# Patient Record
Sex: Male | Born: 1995 | Race: Black or African American | Hispanic: No | Marital: Single | Smoking: Current every day smoker
Health system: Southern US, Community
[De-identification: ages and names within clinical notes are randomized; demographics above are authoritative.]

## PROBLEM LIST (undated history)

## (undated) DIAGNOSIS — G4733 Obstructive sleep apnea (adult) (pediatric): Secondary | ICD-10-CM

## (undated) DIAGNOSIS — E785 Hyperlipidemia, unspecified: Secondary | ICD-10-CM

## (undated) DIAGNOSIS — J45909 Unspecified asthma, uncomplicated: Secondary | ICD-10-CM

## (undated) DIAGNOSIS — N289 Disorder of kidney and ureter, unspecified: Secondary | ICD-10-CM

## (undated) DIAGNOSIS — K219 Gastro-esophageal reflux disease without esophagitis: Secondary | ICD-10-CM

## (undated) DIAGNOSIS — I219 Acute myocardial infarction, unspecified: Secondary | ICD-10-CM

## (undated) DIAGNOSIS — I10 Essential (primary) hypertension: Secondary | ICD-10-CM

---

## 2019-09-07 ENCOUNTER — Emergency Department (HOSPITAL_COMMUNITY)

## 2019-09-07 ENCOUNTER — Other Ambulatory Visit: Payer: Self-pay

## 2019-09-07 ENCOUNTER — Encounter (HOSPITAL_COMMUNITY): Payer: Self-pay

## 2019-09-07 ENCOUNTER — Observation Stay (HOSPITAL_COMMUNITY)
Admission: EM | Admit: 2019-09-07 | Discharge: 2019-09-08 | Disposition: A | Discharge status: Home / Self Care | Attending: Family Medicine | Admitting: Family Medicine

## 2019-09-07 DIAGNOSIS — R079 Chest pain, unspecified: Secondary | ICD-10-CM | POA: Diagnosis not present

## 2019-09-07 DIAGNOSIS — F17210 Nicotine dependence, cigarettes, uncomplicated: Secondary | ICD-10-CM | POA: Insufficient documentation

## 2019-09-07 DIAGNOSIS — Z20822 Contact with and (suspected) exposure to covid-19: Secondary | ICD-10-CM | POA: Insufficient documentation

## 2019-09-07 DIAGNOSIS — J209 Acute bronchitis, unspecified: Secondary | ICD-10-CM | POA: Diagnosis present

## 2019-09-07 DIAGNOSIS — F1590 Other stimulant use, unspecified, uncomplicated: Secondary | ICD-10-CM | POA: Diagnosis present

## 2019-09-07 DIAGNOSIS — F1410 Cocaine abuse, uncomplicated: Secondary | ICD-10-CM | POA: Insufficient documentation

## 2019-09-07 DIAGNOSIS — R0789 Other chest pain: Secondary | ICD-10-CM | POA: Diagnosis not present

## 2019-09-07 DIAGNOSIS — Z720 Tobacco use: Secondary | ICD-10-CM | POA: Diagnosis present

## 2019-09-07 DIAGNOSIS — R0602 Shortness of breath: Secondary | ICD-10-CM | POA: Diagnosis present

## 2019-09-07 DIAGNOSIS — F1491 Cocaine use, unspecified, in remission: Secondary | ICD-10-CM

## 2019-09-07 LAB — CBC
HCT: 44.6 % (ref 39.0–52.0)
Hemoglobin: 15.2 g/dL (ref 13.0–17.0)
MCH: 30.8 pg (ref 26.0–34.0)
MCHC: 34.1 g/dL (ref 30.0–36.0)
MCV: 90.5 fL (ref 80.0–100.0)
Platelets: 293 10*3/uL (ref 150–400)
RBC: 4.93 MIL/uL (ref 4.22–5.81)
RDW: 12.6 % (ref 11.5–15.5)
WBC: 9.4 10*3/uL (ref 4.0–10.5)
nRBC: 0 % (ref 0.0–0.2)

## 2019-09-07 LAB — TROPONIN I (HIGH SENSITIVITY)
Troponin I (High Sensitivity): 4 ng/L (ref <18)
Troponin I (High Sensitivity): 5 ng/L (ref <18)

## 2019-09-07 LAB — D-DIMER, QUANTITATIVE: D-Dimer, Quant: 0.3 ug/mL-FEU (ref 0.00–0.50)

## 2019-09-07 LAB — BASIC METABOLIC PANEL
Anion gap: 14 (ref 5–15)
BUN: 18 mg/dL (ref 6–20)
CO2: 23 mmol/L (ref 22–32)
Calcium: 9.9 mg/dL (ref 8.9–10.3)
Chloride: 100 mmol/L (ref 98–111)
Creatinine, Ser: 1.24 mg/dL (ref 0.61–1.24)
GFR calc Af Amer: >60 mL/min (ref >60)
GFR calc non Af Amer: >60 mL/min (ref >60)
Glucose, Bld: 99 mg/dL (ref 70–99)
Potassium: 3.5 mmol/L (ref 3.5–5.1)
Sodium: 137 mmol/L (ref 135–145)

## 2019-09-07 LAB — RAPID URINE DRUG SCREEN, HOSP PERFORMED
Amphetamines: POSITIVE — AB
Barbiturates: NOT DETECTED
Benzodiazepines: NOT DETECTED
Cocaine: NOT DETECTED
Opiates: NOT DETECTED
Tetrahydrocannabinol: NOT DETECTED

## 2019-09-07 MED ORDER — ONDANSETRON HCL 4 MG/2ML IJ SOLN: 4.0000 mg | Freq: Four times a day (QID) | INTRAMUSCULAR | Status: DC | PRN

## 2019-09-07 MED ORDER — ACETAMINOPHEN 325 MG PO TABS: 650.0000 mg | ORAL_TABLET | Freq: Four times a day (QID) | ORAL | Status: DC | PRN

## 2019-09-07 MED ORDER — NITROGLYCERIN 2 % TD OINT
1.0000 [in_us] | TOPICAL_OINTMENT | Freq: Once | TRANSDERMAL | Status: AC
  Administered 2019-09-07: 1 [in_us] via TOPICAL
  Filled 2019-09-07: qty 1

## 2019-09-07 MED ORDER — LORAZEPAM 2 MG/ML IJ SOLN
1.0000 mg | Freq: Once | INTRAMUSCULAR | Status: AC
  Administered 2019-09-07: 1 mg via INTRAVENOUS
  Filled 2019-09-07: qty 1

## 2019-09-07 MED ORDER — ONDANSETRON HCL 4 MG/2ML IJ SOLN
4.0000 mg | Freq: Once | INTRAMUSCULAR | Status: AC
  Administered 2019-09-07: 4 mg via INTRAVENOUS
  Filled 2019-09-07: qty 2

## 2019-09-07 MED ORDER — ENOXAPARIN SODIUM 40 MG/0.4ML ~~LOC~~ SOLN
40.0000 mg | SUBCUTANEOUS | Status: DC
  Administered 2019-09-08: 40 mg via SUBCUTANEOUS
  Filled 2019-09-07: qty 0.4

## 2019-09-07 MED ORDER — ASPIRIN 81 MG PO CHEW
324.0000 mg | CHEWABLE_TABLET | Freq: Once | ORAL | Status: AC
  Administered 2019-09-07: 324 mg via ORAL
  Filled 2019-09-07: qty 4

## 2019-09-07 MED ORDER — NITROGLYCERIN 0.4 MG SL SUBL
0.4000 mg | SUBLINGUAL_TABLET | Freq: Once | SUBLINGUAL | Status: AC
  Administered 2019-09-07: 0.4 mg via SUBLINGUAL
  Filled 2019-09-07: qty 1

## 2019-09-07 MED ORDER — NITROGLYCERIN 0.4 MG SL SUBL
SUBLINGUAL_TABLET | SUBLINGUAL | Status: AC
  Filled 2019-09-07: qty 2

## 2019-09-07 MED ORDER — ACETAMINOPHEN 650 MG RE SUPP: 650.0000 mg | Freq: Four times a day (QID) | RECTAL | Status: DC | PRN

## 2019-09-07 MED ORDER — MORPHINE SULFATE (PF) 4 MG/ML IV SOLN
4.0000 mg | Freq: Once | INTRAVENOUS | Status: AC
  Administered 2019-09-07: 4 mg via INTRAVENOUS
  Filled 2019-09-07: qty 1

## 2019-09-07 MED ORDER — POTASSIUM CHLORIDE IN NACL 20-0.9 MEQ/L-% IV SOLN: INTRAVENOUS | Status: AC

## 2019-09-07 MED ORDER — ONDANSETRON HCL 4 MG PO TABS: 4.0000 mg | ORAL_TABLET | Freq: Four times a day (QID) | ORAL | Status: DC | PRN

## 2019-09-07 NOTE — ED Triage Notes (Signed)
Pt coming from [place] due to SOB for 2 days and getting for last hour. Pt used MDI without relief. No cough . Reports pain left chest

## 2019-09-07 NOTE — ED Notes (Signed)
[place] at 2 LPM for SOB

## 2019-09-07 NOTE — ED Provider Notes (Signed)
[facility] EMERGENCY DEPARTMENT Provider Note   CSN: [id number] Arrival date & time: 09/07/19  1656     History Chief Complaint  Patient presents with  . Shortness of Breath    Johnny Hebert is a 24 y.o. male with no significant past medical history reports having some mild shortness of breath over the past several days.  Today while he was walking on a track he developed increased shortness of breath in addition to sharp left-sided chest pain which has been constant for the past hour.  He has had several doses of an albuterol MDI given by the medical staff at his present, his last dose of this was just prior to arrival.  He denies improvement with this medication.  He does have a history of asthma and does endorse some mild wheezing over the past several days.  He denies peripheral edema, pain or swelling of his extremities.  He does smoke cigarettes, he denies drug abuse.  No significant family history of cardiac disease and early age.  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  He was referred to a cardiologist which he has not been able to see, as he was incarcerated before his appointment.   The history is provided by the patient.       History reviewed. No pertinent past medical history.  There are no problems to display for this patient.   History reviewed. No pertinent surgical history.     No family history on file.  Social History   Tobacco Use  . Smoking status: Current Every Day Smoker    Packs/day: 0.50    Types: Cigarettes  Substance Use Topics  . Alcohol use: Not Currently  . Drug use: Not Currently    Home Medications Prior to Admission medications   Not on File    Allergies    Patient has no known allergies.  Review of Systems   Review of Systems  Constitutional: Negative for chills and fever.  HENT: Negative for congestion and sore throat.   Eyes: Negative.     Respiratory: Positive for chest tightness and shortness of breath.   Cardiovascular: Positive for chest pain. Negative for palpitations and leg swelling.  Gastrointestinal: Negative for abdominal pain and nausea.  Genitourinary: Negative.   Musculoskeletal: Negative for arthralgias, joint swelling and neck pain.  Skin: Negative.  Negative for rash and wound.  Neurological: Negative for dizziness, weakness, light-headedness, numbness and headaches.  Psychiatric/Behavioral: Negative.     Physical Exam Updated Vital Signs BP (!) 140/97 (BP Location: Left Arm)   Pulse 77   Temp 98.2 F (36.8 C) (Oral)   Resp (!) 30   Ht 5\' 5"  (1.651 m)   Wt 119.7 kg   SpO2 99%   BMI 43.93 kg/m   Physical Exam Vitals and nursing note reviewed.  Constitutional:      Appearance: He is well-developed.  HENT:     Head: Normocephalic and atraumatic.  Eyes:     Conjunctiva/sclera: Conjunctivae normal.  Cardiovascular:     Rate and Rhythm: Normal rate and regular rhythm.     Heart sounds: Normal heart sounds.  Pulmonary:     Effort: Pulmonary effort is normal.     Breath sounds: Normal breath sounds. No decreased breath sounds, wheezing or rhonchi.  Abdominal:     General: Bowel sounds are normal.     Palpations: Abdomen is soft.  Tenderness: There is no abdominal tenderness.  Musculoskeletal:        General: Normal range of motion.     Cervical back: Normal range of motion.     Right lower leg: No tenderness. No edema.     Left lower leg: No tenderness. No edema.  Skin:    General: Skin is warm and dry.  Neurological:     General: No focal deficit present.     Mental Status: He is alert.     ED Results / Procedures / Treatments   Labs (all labs ordered are listed, but only abnormal results are displayed) Labs Reviewed  BASIC METABOLIC PANEL  CBC  D-DIMER, QUANTITATIVE (NOT AT [facility])  RAPID URINE DRUG SCREEN, HOSP PERFORMED  TROPONIN I (HIGH SENSITIVITY)    EKG EKG  Interpretation  Date/Time:  Sunday September 07 2019 17:43:49 EDT Ventricular Rate:  80 PR Interval:    QRS Duration: 88 QT Interval:  345 QTC Calculation: 398 R Axis:   81 Text Interpretation: Sinus rhythm RSR' in V1 or V2, right VCD or RVH No previous ECGs available Confirmed by Fredia Sorrow [phone]) on 09/07/2019 7:17:43 PM   Radiology DG Chest 2 View  Result Date: 09/07/2019 CLINICAL DATA:  Dyspnea for 2 days, smoker EXAM: CHEST - 2 VIEW COMPARISON:  None. FINDINGS: Normal heart size. Normal mediastinal contour. No pneumothorax. No pleural effusion. No acute consolidative airspace disease. No overt pulmonary edema. Mild peribronchial cuffing. No significant lung hyperinflation. IMPRESSION: Mild peribronchial cuffing, suggesting bronchitis.  No pneumonia. Electronically Signed   By: Ilona Sorrel M.D.   On: 09/07/2019 18:14    Procedures Procedures (including critical care time)  Medications Ordered in ED Medications  nitroGLYCERIN (NITROSTAT) 0.4 MG SL tablet (has no administration in time range)  aspirin chewable tablet 324 mg (324 mg Oral Given 09/07/19 1847)  nitroGLYCERIN (NITROSTAT) SL tablet 0.4 mg (0.4 mg Sublingual Given 09/07/19 1836)  morphine 4 MG/ML injection 4 mg (4 mg Intravenous Given 09/07/19 1905)  ondansetron (ZOFRAN) injection 4 mg (4 mg Intravenous Given 09/07/19 1904)  nitroGLYCERIN (NITROGLYN) 2 % ointment 1 inch (1 inch Topical Given 09/07/19 1905)    ED Course  I have reviewed the triage vital signs and the nursing notes.  Pertinent labs & imaging results that were available during my care of the patient were reviewed by me and considered in my medical decision making (see chart for details).    MDM Rules/Calculators/A&P                          Pt was given aspirin 325 mg,  ntg x 1 after which he developed sinus tachycardic to 140-150 range.  No improvement with valsalva or carotid massage.  Ekg  Sinus tachy, initial troponin normal range.    7:14 PM Pt  admitted to RN at this time that he did a "couple lines of cocaine" today.    Pending additional labs, repeat ekg still sinus tachy. Morphine added, ntg paste in place of SL ntg.   Pt signed out to Alyse Low PA at shift change pending remaining labs including delta trop, uds.   Final Clinical Impression(s) / ED Diagnoses Final diagnoses:  Chest pain, unspecified type  Cocaine abuse [facility])    Rx / DC Orders ED Discharge Orders    None       Landis Martins 09/07/19 1918    Fredia Sorrow, MD 09/15/19 [id number]

## 2019-09-07 NOTE — H&P (Signed)
History and Physical    Johnny Hebert QPY:[id number] DOB: 1996/03/08 DOA: 09/07/2019  PCP: Patient, No Pcp Per  Patient coming from: Home.  I have personally briefly reviewed patient's old medical records in [place] [facility]  Chief Complaint: Chest pain.  HPI: Johnny Hebert is a 24 y.o. 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  He had several doses of albuterol MDI prior to arrival.  He mentions becoming very anxious because he was thinking he was having "heart attack".  He denies fever, rhinorrhea, chills, hemoptysis, dizziness, PND, orthopnea or pitting edema of the lower extremities.  No abdominal pain, nausea, vomiting, diarrhea, constipation, melena or hematochezia.  No dysuria, frequency or hematuria.  ED Course: Initial vital signs were temperature 98.2 F, pulse 99, respirations 30, BP 140/97 and O2 sat 96% on room air.  The patient was given supplemental oxygen, chewable aspirin 324 mg, sublingual nitroglycerin,, lorazepam 1 mg IVP, morphine 4 mg IVP x1 and ondansetron 4 mg IVP.  UDS was positive for amphetamines.  CBC, D-dimer BMP and troponin x2.  See multiple EKG results below.  His chest radiograph shows peribronchial cuffing.  Review of Systems: As per HPI otherwise all other systems reviewed and are negative.  History reviewed. No pertinent past medical history.  History reviewed. No pertinent surgical history.  Social History  reports that he has been smoking cigarettes. He has been smoking about 0.50 packs per day. He does not have any smokeless tobacco history on file. He reports previous alcohol use. He  reports previous drug use.  No Known Allergies  Family medical history. Both parents with history of hypertension.  Prior to Admission medications   Not on File    Physical Exam: Vitals:   09/07/19 2158 09/07/19 2246 09/07/19 2301 09/07/19 2330  BP: (!) 104/45  124/72 129/77  Pulse: (!) 113     Resp: (!) 28  (!) 26 (!) 24  Temp:      TempSrc:      SpO2: 100% 100%    Weight:      Height:       Constitutional: NAD, calm, comfortable Eyes: PERRL, lids and conjunctivae normal ENMT: Mucous membranes are moist. Posterior pharynx clear of any exudate or lesions. Neck: normal, supple, no masses, no thyromegaly Respiratory: clear to auscultation bilaterally, no wheezing, no crackles. Normal respiratory effort. No accessory muscle use.  Cardiovascular: Regular rate and rhythm, no murmurs / rubs / gallops. No extremity edema. 2+ pedal pulses. No carotid bruits.  Abdomen: Obese, nondistended.  Soft, no tenderness, no masses palpated. No hepatosplenomegaly. Bowel sounds positive.  Musculoskeletal: no clubbing / cyanosis. Good ROM, no contractures. Normal muscle tone.  Skin: no acute or clinically significant rashes, lesions, ulcers on very limited dermatological examination. Neurologic: CN 2-12 grossly intact. Sensation intact, DTR normal. Strength 5/5 in all 4.  Psychiatric: Normal judgment and insight. Alert and oriented x 3. Normal mood.   Labs on Admission: I have personally reviewed following labs and imaging studies  CBC: Recent Labs  Lab 09/07/19 1820  WBC 9.4  HGB 15.2  HCT 44.6  MCV 90.5  PLT 124   Basic Metabolic Panel: Recent Labs  Lab  09/07/19 1820  NA 137  K 3.5  CL 100  CO2 23  GLUCOSE 99  BUN 18  CREATININE 1.24  CALCIUM 9.9   GFR: Estimated Creatinine Clearance: 109.2 mL/min (by C-G formula based on SCr of 1.24 mg/dL).  Liver Function Tests: No results for input(s): AST, ALT, ALKPHOS, BILITOT, PROT, ALBUMIN in the last 168 hours.  Urine  analysis: No results found for: COLORURINE, APPEARANCEUR, LABSPEC, PHURINE, GLUCOSEU, HGBUR, BILIRUBINUR, KETONESUR, PROTEINUR, UROBILINOGEN, NITRITE, LEUKOCYTESUR  Radiological Exams on Admission: DG Chest 2 View  Result Date: 09/07/2019 CLINICAL DATA:  Dyspnea for 2 days, smoker EXAM: CHEST - 2 VIEW COMPARISON:  None. FINDINGS: Normal heart size. Normal mediastinal contour. No pneumothorax. No pleural effusion. No acute consolidative airspace disease. No overt pulmonary edema. Mild peribronchial cuffing. No significant lung hyperinflation. IMPRESSION: Mild peribronchial cuffing, suggesting bronchitis.  No pneumonia. Electronically Signed   By: Delbert Phenix M.D.   On: 09/07/2019 18:14   EKG: Independently reviewed.  EKG #1 Vent. rate 80 BPM PR interval * ms QRS duration 88 ms QT/QTc 345/398 ms P-R-T axes 47 81 49 Sinus rhythm RSR' in V1 or V2, right VCD or RVH  EKG #2 Vent. rate 128 BPM PR interval * ms QRS duration 90 ms QT/QTc 306/447 ms P-R-T axes 36 89 37 Sinus tachycardia Probable left atrial enlargement Anteroseptal infarct, age indeterminate Lateral leads are also involved Baseline wander in lead(s) V6  EKG #3  Vent. rate 147 BPM PR interval * ms QRS duration 85 ms QT/QTc 281/440 ms P-R-T axes 56 125 42 Sinus tachycardia Anteroseptal infarct, age indeterminate  EKG #4 Vent. rate 128 BPM PR interval * ms QRS duration 89 ms QT/QTc 308/450 ms P-R-T axes 59 94 45 Sinus tachycardia LAE, consider biatrial enlargement Anteroseptal infarct, age indeterminate  Assessment/Plan Principal Problem:   Atypical chest pain (History of enlarged heart as a child) Observation/telemetry. Continue supplemental oxygen. Toradol 30 mg IVP x1 dose. Solu-Medrol 125 mg IVP x1 dose. Oxycodone 5 mg every 4 hours as needed. Repeat EKG in a.m. Check echocardiogram given electrocardiographic abnormalities  Active Problems:   Asthma Supplemental oxygen. Bronchodilators as  needed. Single dose Solu-Medrol. Smoking cessation.    Tobacco use Nicotine replacement therapy as needed. Staff to provide tobacco cessation information.    Amphetamine use (HCC) Advised against the use.    DVT prophylaxis: Lovenox SQ. Code Status:   Full code. Family Communication:   Disposition Plan:   Patient is from:  [facility].  Anticipated DC to:  [facility]  Anticipated DC date:  09/08/2019.  Anticipated DC barriers: Clinical improvement in echocardiogram.  Consults called: Admission status:  Observation/telemetry.  Severity of Illness:  High.  Bobette Mo MD [facility]  How to contact the [facility] Attending or Consulting provider 7A - 7P or covering provider during after hours 7P -7A, for this patient?   1. Check the care team in [facility] and look for a) attending/consulting [facility] provider listed and b) the [facility] team listed 2. Log into [email or web address] and use [place]'s universal password to access. If you do not have the password, please contact the hospital operator. 3. Locate the [facility] provider you are looking for under [facility] and page to a number that you can be directly reached. 4. If you still have difficulty reaching the provider, please page the [facility] (Director on Call) for the Hospitalists listed on amion for assistance.  09/07/2019, 11:50 PM   This document was prepared using Conservation officer, historic buildings  and may contain some unintended transcription errors.

## 2019-09-07 NOTE — ED Provider Notes (Signed)
Pt's care assumed from [facility].  Pt having severe chest pain. Pt reports he had an enlarged heart as a child.   Heart rate 130.  Pt sweating.  Pt admits to using 2 lines of cocaine tonight.   Nitro paste, morphine IV.  Pt remains anxious and continues to have pain.  Troponin is negative x 2.  Ddimer is negative,   Drug screen is positive for amphetamines.  Pt given ativan 1mg  IV.   I will consult hospitalist for admission    09/07/19 2324    09/09/19, MD 09/15/19 [phone]

## 2019-09-08 ENCOUNTER — Encounter (HOSPITAL_COMMUNITY): Payer: Self-pay | Admitting: Internal Medicine

## 2019-09-08 ENCOUNTER — Observation Stay (HOSPITAL_BASED_OUTPATIENT_CLINIC_OR_DEPARTMENT_OTHER)

## 2019-09-08 ENCOUNTER — Observation Stay (HOSPITAL_COMMUNITY)

## 2019-09-08 DIAGNOSIS — Z720 Tobacco use: Secondary | ICD-10-CM

## 2019-09-08 DIAGNOSIS — R079 Chest pain, unspecified: Secondary | ICD-10-CM

## 2019-09-08 DIAGNOSIS — F1491 Cocaine use, unspecified, in remission: Secondary | ICD-10-CM

## 2019-09-08 DIAGNOSIS — J209 Acute bronchitis, unspecified: Secondary | ICD-10-CM

## 2019-09-08 DIAGNOSIS — F1510 Other stimulant abuse, uncomplicated: Secondary | ICD-10-CM

## 2019-09-08 DIAGNOSIS — Z87898 Personal history of other specified conditions: Secondary | ICD-10-CM | POA: Diagnosis not present

## 2019-09-08 DIAGNOSIS — R0789 Other chest pain: Secondary | ICD-10-CM | POA: Diagnosis not present

## 2019-09-08 HISTORY — DX: Morbid (severe) obesity due to excess calories: E66.01

## 2019-09-08 LAB — MRSA PCR SCREENING: MRSA by PCR: NEGATIVE

## 2019-09-08 LAB — ECHOCARDIOGRAM COMPLETE
Height: 65 in
Weight: 4560.88 oz

## 2019-09-08 LAB — MAGNESIUM: Magnesium: 1.8 mg/dL (ref 1.7–2.4)

## 2019-09-08 LAB — HIV ANTIBODY (ROUTINE TESTING W REFLEX): HIV Screen 4th Generation wRfx: NONREACTIVE

## 2019-09-08 LAB — PHOSPHORUS: Phosphorus: 2.8 mg/dL (ref 2.5–4.6)

## 2019-09-08 LAB — SARS CORONAVIRUS 2 BY RT PCR (HOSPITAL ORDER, PERFORMED IN ~~LOC~~ HOSPITAL LAB): SARS Coronavirus 2: NEGATIVE

## 2019-09-08 MED ORDER — MUCINEX 600 MG PO TB12
600.0000 mg | ORAL_TABLET | Freq: Two times a day (BID) | ORAL | 0 refills | Status: AC
Start: 2019-09-08 — End: 2019-09-15

## 2019-09-08 MED ORDER — METHYLPREDNISOLONE SODIUM SUCC 125 MG IJ SOLR
125.0000 mg | Freq: Once | INTRAMUSCULAR | Status: AC
  Administered 2019-09-08: 125 mg via INTRAVENOUS
  Filled 2019-09-08: qty 2

## 2019-09-08 MED ORDER — FAMOTIDINE 20 MG PO TABS
20.0000 mg | ORAL_TABLET | Freq: Two times a day (BID) | ORAL | Status: DC
  Administered 2019-09-08: 20 mg via ORAL
  Filled 2019-09-08: qty 1

## 2019-09-08 MED ORDER — NITROGLYCERIN 0.4 MG SL SUBL
SUBLINGUAL_TABLET | SUBLINGUAL | Status: AC
  Filled 2019-09-08: qty 1

## 2019-09-08 MED ORDER — ALPRAZOLAM 0.5 MG PO TABS
0.5000 mg | ORAL_TABLET | Freq: Once | ORAL | Status: AC
  Administered 2019-09-08: 0.5 mg via ORAL
  Filled 2019-09-08: qty 1

## 2019-09-08 MED ORDER — ACETAMINOPHEN 325 MG PO TABS: 650.0000 mg | ORAL_TABLET | Freq: Four times a day (QID) | ORAL | Status: DC | PRN

## 2019-09-08 MED ORDER — ALBUTEROL SULFATE HFA 108 (90 BASE) MCG/ACT IN AERS
2.0000 | INHALATION_SPRAY | RESPIRATORY_TRACT | 0 refills | Status: AC | PRN
Start: 2019-09-08 — End: ?

## 2019-09-08 MED ORDER — ACETAMINOPHEN 325 MG PO TABS: 650.0000 mg | ORAL_TABLET | Freq: Four times a day (QID) | ORAL | Status: AC | PRN

## 2019-09-08 MED ORDER — OXYCODONE HCL 5 MG PO TABS
5.0000 mg | ORAL_TABLET | ORAL | Status: DC | PRN
  Administered 2019-09-08: 5 mg via ORAL
  Filled 2019-09-08: qty 1

## 2019-09-08 MED ORDER — KETOROLAC TROMETHAMINE 30 MG/ML IJ SOLN
30.0000 mg | Freq: Once | INTRAMUSCULAR | Status: AC
  Administered 2019-09-08: 30 mg via INTRAVENOUS
  Filled 2019-09-08: qty 1

## 2019-09-08 MED ORDER — ALPRAZOLAM 0.5 MG PO TABS: 0.5000 mg | ORAL_TABLET | Freq: Three times a day (TID) | ORAL | Status: DC | PRN

## 2019-09-08 MED ORDER — ENOXAPARIN SODIUM 40 MG/0.4ML ~~LOC~~ SOLN: 40.0000 mg | SUBCUTANEOUS | Status: DC

## 2019-09-08 MED ORDER — OMEPRAZOLE 20 MG PO CPDR: 20.0000 mg | DELAYED_RELEASE_CAPSULE | Freq: Every day | ORAL | 0 refills | Status: AC

## 2019-09-08 MED ORDER — IPRATROPIUM-ALBUTEROL 0.5-2.5 (3) MG/3ML IN SOLN: 3.0000 mL | RESPIRATORY_TRACT | Status: DC | PRN

## 2019-09-08 NOTE — Discharge Summary (Signed)
Physician Discharge Summary  Johnny Hebert ZOX:[id number] DOB: 1995/06/19 DOA: 09/07/2019  PCP: Patient, No Pcp Per  Admit date: 09/07/2019 Discharge date: 09/08/2019  Admitted From:  In custody (Incarcerated)  Disposition:  Return to custody (incarcerated)  Recommendations for Outpatient Follow-up:  1. Follow up with PCP when able  2. Avoid all recreational drugs and alcohol 3. Stop smoking and using tobacco products   Discharge Condition: Medically Stable   CODE STATUS: FULL    Brief Hospitalization Summary: Please see all hospital notes, images, labs for full details of the hospitalization.  ADMISSION HPI: Johnny Hebert is a 24 y.o. 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  He had several doses of albuterol MDI prior to arrival.  He mentions becoming very anxious because he was thinking he was having "heart attack".  He denies fever, rhinorrhea, chills, hemoptysis, dizziness, PND, orthopnea or pitting edema of the lower extremities.  No abdominal pain, nausea, vomiting, diarrhea, constipation, melena or hematochezia.  No dysuria, frequency or hematuria.  ED Course: Initial vital signs were temperature 98.2 F, pulse 99, respirations 30, BP 140/97 and O2 sat 96% on room air.  The patient was given supplemental oxygen, chewable aspirin 324 mg, sublingual nitroglycerin,, lorazepam 1 mg IVP, morphine 4 mg IVP x1 and ondansetron 4 mg IVP.  UDS was positive for amphetamines.  CBC, D-dimer BMP and troponin x2.  See multiple EKG results below.  His chest radiograph shows peribronchial cuffing.  The patient was admitted for  observation for atypical chest pain symptoms.  He had been using recreational drugs reportedly cocaine and amphetamines just prior to being incarcerated.  He complained of severe chest pain and some wheezing.  His chest x-ray showed findings of peribronchial cuffing suggesting bronchitis.  No other abnormalities.  His labs were totally normal.  His EKG was normal sinus rhythm with no ST-T wave abnormalities.  His high-sensitivity troponin was 4 and 5.  His urine drug screen was positive for amphetamines.  His SARS 2 coronavirus test was negative.  His MRSA PCR test was negative.  During part of the day he complained of an acute episode of chest pain he had a repeat EKG done and chest x-ray with normal findings.  His symptoms were thought to be related to a panic attack.  He was given a dose of alprazolam 0.5 mg x 1 and his symptoms resolved.  I suspect most of his symptoms are related to anxiety disorder and panic attacks.  But there was no findings of cardiac ischemia.  He had a 2D echocardiogram completed prior to discharge with normal findings noted below.  The patient is stable to discharge.  Will be discharged with a prescription for omeprazole, albuterol HFA and Mucinex and Tylenol as needed for pain.  He was strongly advised to stop using recreational drugs and smoking cigarettes alcohol.  He was advised to avoid all tobacco products.  He verbalized understanding.  Return as needed.  2D echocardiogram 09/08/2019 IMPRESSIONS  1. Left ventricular ejection fraction, by estimation, is 60 to 65%. The left ventricle has normal function. The left ventricle has no regional  wall motion abnormalities. There is mild left ventricular hypertrophy. Left ventricular  diastolic parameters were normal.  2. Right ventricular systolic function is normal. The right ventricular size is normal.  3. The mitral valve is normal in structure. No evidence of mitral valve regurgitation.  4. The aortic valve is normal in  structure. Aortic valve regurgitation is not visualized.  5. The inferior vena cava is normal in size with greater than 50% respiratory variability, suggesting right atrial pressure of 3 mmHg.   Discharge Diagnoses:  Principal Problem:   Atypical chest pain Active Problems:   Tobacco use   Amphetamine use (HCC)   History of cocaine use   Acute bronchitis   Discharge Instructions:  Allergies as of 09/08/2019   No Known Allergies     Medication List    TAKE these medications   acetaminophen 325 MG tablet Commonly known as: TYLENOL Take 2 tablets (650 mg total) by mouth every 6 (six) hours as needed for mild pain, moderate pain or headache (or Fever >/= 101).   albuterol 108 (90 Base) MCG/ACT inhaler Commonly known as: VENTOLIN HFA Inhale 2 puffs into the lungs every 4 (four) hours as needed for wheezing or shortness of breath (cough, shortness of breath or wheezing.).   Mucinex 600 MG 12 hr tablet Generic drug: guaiFENesin Take 1 tablet (600 mg total) by mouth 2 (two) times daily for 7 days.   omeprazole 20 MG capsule Commonly known as: PriLOSEC Take 1 capsule (20 mg total) by mouth daily.       No Known Allergies Allergies as of 09/08/2019   No Known Allergies     Medication List    TAKE these medications   acetaminophen 325 MG tablet Commonly known as: TYLENOL Take 2 tablets (650 mg total) by mouth every 6 (six) hours as needed for mild pain, moderate pain or headache (or Fever >/= 101).   albuterol 108 (90 Base) MCG/ACT inhaler Commonly known as: VENTOLIN HFA Inhale 2 puffs into the lungs every 4 (four) hours as needed for wheezing or shortness of breath (cough, shortness of breath or wheezing.).   Mucinex 600 MG 12 hr tablet Generic drug: guaiFENesin Take 1 tablet (600 mg total) by mouth 2 (two) times daily for 7 days.   omeprazole 20 MG capsule Commonly known as: PriLOSEC Take 1 capsule (20 mg total) by mouth daily.       Procedures/Studies: DG  Chest 2 View  Result Date: 09/07/2019 CLINICAL DATA:  Dyspnea for 2 days, smoker EXAM: CHEST - 2 VIEW COMPARISON:  None. FINDINGS: Normal heart size. Normal mediastinal contour. No pneumothorax. No pleural effusion. No acute consolidative airspace disease. No overt pulmonary edema. Mild peribronchial cuffing. No significant lung hyperinflation. IMPRESSION: Mild peribronchial cuffing, suggesting bronchitis.  No pneumonia. Electronically Signed   By: Ilona Sorrel M.D.   On: 09/07/2019 18:14   DG CHEST PORT 1 VIEW  Result Date: 09/08/2019 CLINICAL DATA:  Shortness of breath EXAM: PORTABLE CHEST 1 VIEW COMPARISON:  09/07/2019 FINDINGS: The heart size and mediastinal contours are within normal limits. Mildly prominent peribronchial markings bilaterally, similar to prior. No new focal airspace consolidation, pleural effusion, or pneumothorax. The visualized skeletal structures are unremarkable. IMPRESSION: Mildly prominent peribronchial markings bilaterally, similar to prior study, may reflect bronchitis. No new focal airspace consolidation. Electronically Signed   By: Davina Poke D.O.   On: 09/08/2019 13:07   ECHOCARDIOGRAM COMPLETE  Result Date: 09/08/2019    ECHOCARDIOGRAM REPORT   Patient Name:   Johnny Hebert Date of Exam: 09/08/2019 Medical Rec #:  [id number]  Height:       65.0 in Accession #:    [id number]         Weight:       285.1 lb Date of Birth:  09-May-1995           BSA:          2.300 m Patient Age:    23 years           BP:           129/80 mmHg Patient Gender: M                  HR:           69 bpm. [facility] Procedure: 2D Echo Indications:    Abnormal ECG 794.31 / R94.31                 Chest Pain 786.50 / R07.9  History:        Patient has no prior history of Echocardiogram examinations.                 Signs/Symptoms:Chest Pain; Risk Factors:Current Smoker.                 Amphetamine Use, inhaled some powder, which he thought was                 cocaine.   Sonographer:    Jeryl Columbia RDCS (AE) Referring Phys: [id number] DAVID MANUEL ORTIZ IMPRESSIONS  1. Left ventricular ejection fraction, by estimation, is 60 to 65%. The left ventricle has normal function. The left ventricle has no regional wall motion abnormalities. There is mild left ventricular hypertrophy. Left ventricular diastolic parameters were normal.  2. Right ventricular systolic function is normal. The right ventricular size is normal.  3. The mitral valve is normal in structure. No evidence of mitral valve regurgitation.  4. The aortic valve is normal in structure. Aortic valve regurgitation is not visualized.  5. The inferior vena cava is normal in size with greater than 50% respiratory variability, suggesting right atrial pressure of 3 mmHg. FINDINGS  Left Ventricle: Left ventricular ejection fraction, by estimation, is 60 to 65%. The left ventricle has normal function. The left ventricle has no regional wall motion abnormalities. The left ventricular internal cavity size was normal in size. There is  mild left ventricular hypertrophy. Left ventricular diastolic parameters were normal. Right Ventricle: The right ventricular size is normal. No increase in right ventricular wall thickness. Right ventricular systolic function is normal. Left Atrium: Left atrial size was normal in size. Right Atrium: Right atrial size was normal in size. Pericardium: There is no evidence of pericardial effusion. Mitral Valve: The mitral valve is normal in structure. No evidence of mitral valve regurgitation. Tricuspid Valve: The tricuspid valve is normal in structure. Tricuspid valve regurgitation is trivial. Aortic Valve: The aortic valve is normal in structure. Aortic valve regurgitation is not visualized. Pulmonic Valve: The pulmonic valve was not well visualized. Pulmonic valve regurgitation is not visualized. Aorta: The aortic root is normal in size and structure. Venous: The inferior vena cava is normal in size with  greater than 50% respiratory variability, suggesting right atrial pressure of 3 mmHg. IAS/Shunts: No atrial level shunt detected by color flow Doppler.  LEFT VENTRICLE PLAX 2D LVIDd:         4.19 cm  Diastology LVIDs:         2.37 cm  LV e' lateral:   9.79  cm/s LV PW:         1.30 cm  LV E/e' lateral: 6.3 LV IVS:        0.98 cm  LV e' medial:    7.94 cm/s LVOT diam:     2.20 cm  LV E/e' medial:  7.8 LVOT Area:     3.80 cm  RIGHT VENTRICLE RV S prime:     16.20 cm/s TAPSE (M-mode): 2.8 cm LEFT ATRIUM           Index       RIGHT ATRIUM           Index LA diam:      3.10 cm 1.35 cm/m  RA Area:     10.20 cm LA Vol (A2C): 38.8 ml 16.87 ml/m RA Volume:   21.30 ml  9.26 ml/m LA Vol (A4C): 34.2 ml 14.87 ml/m   AORTA Ao Root diam: 3.30 cm MITRAL VALVE MV Area (PHT): 2.32 cm    SHUNTS MV Decel Time: 327 msec    Systemic Diam: 2.20 cm MV E velocity: 61.60 cm/s MV A velocity: 57.20 cm/s MV E/A ratio:  1.08 Dietrich Pates MD Electronically signed by Dietrich Pates MD Signature Date/Time: 09/08/2019/5:29:56 PM    Final       Subjective: Pt having a lot of anxiety likely secondary to the recreational drugs he was taking. He has some mild wheezing.   Discharge Exam: Vitals:   09/08/19 0534 09/08/19 1516  BP: 129/80 129/86  Pulse: 69 62  Resp: 16   Temp: 98 F (36.7 C) 97.9 F (36.6 C)  SpO2: 98% 99%   Vitals:   09/08/19 0101 09/08/19 0146 09/08/19 0534 09/08/19 1516  BP: 129/79 (!) 135/91 129/80 129/86  Pulse: 77 98 69 62  Resp: (!) 25 18 16    Temp:  98.1 F (36.7 C) 98 F (36.7 C) 97.9 F (36.6 C)  TempSrc:  Oral Oral Oral  SpO2: 90% 100% 98% 99%  Weight:  129.3 kg    Height:  5\' 5"  (1.651 m)     General: Pt is alert, awake, not in acute distress Cardiovascular: RRR, S1/S2 +, no rubs, no gallops Respiratory: good air movement bilaterally, rare exp wheezing at right base, no rhonchi Abdominal: Soft, NT, ND, bowel sounds + Extremities: no edema, no cyanosis   The results of significant diagnostics  from this hospitalization (including imaging, microbiology, ancillary and laboratory) are listed below for reference.     Microbiology: Recent Results (from the past 240 hour(s))  SARS Coronavirus 2 by RT PCR (hospital order, performed in [facility] hospital lab) Nasopharyngeal Nasopharyngeal Swab     Status: None   Collection Time: 09/07/19 11:43 PM   Specimen: Nasopharyngeal Swab  Result Value Ref Range Status   SARS Coronavirus 2 NEGATIVE NEGATIVE Final    Comment: (NOTE) SARS-CoV-2 target nucleic acids are NOT DETECTED.  The SARS-CoV-2 RNA is generally detectable in upper and lower respiratory specimens during the acute phase of infection. The lowest concentration of SARS-CoV-2 viral copies this assay can detect is 250 copies / mL. A negative result does not preclude SARS-CoV-2 infection and should not be used as the sole basis for treatment or other patient management decisions.  A negative result may occur with improper specimen collection / handling, submission of specimen other than nasopharyngeal swab, presence of viral mutation(s) within the areas targeted by this assay, and inadequate number of viral copies (<250 copies / mL). A negative result must be combined with  clinical observations, patient history, and epidemiological information.  Fact Sheet for Patients:   BoilerBrush.com.cyhttps://www.fda.gov/media/136312/download  Fact Sheet for Healthcare Providers: https://pope.com/https://www.fda.gov/media/136313/download  This test is not yet approved or  cleared by the [place] FDA and has been authorized for detection and/or diagnosis of SARS-CoV-2 by FDA under an Emergency Use Authorization (EUA).  This EUA will remain in effect (meaning this test can be used) for the duration of the COVID-19 declaration under Section 564(b)(1) of the Act, 21 U.S.C. section 360bbb-3(b)(1), unless the authorization is terminated or revoked sooner.  Performed at [facility], [address] [place], [place]  [id number]   MRSA PCR Screening     Status: None   Collection Time: 09/08/19  2:19 AM   Specimen: Nasopharyngeal  Result Value Ref Range Status   MRSA by PCR NEGATIVE NEGATIVE Final    Comment:        The GeneXpert MRSA Assay (FDA approved for NASAL specimens only), is one component of a comprehensive MRSA colonization surveillance program. It is not intended to diagnose MRSA infection nor to guide or monitor treatment for MRSA infections. Performed at [facility], [address] [place], [place] [address]      Labs: BNP (last 3 results) No results for input(s): BNP in the last 8760 hours. Basic Metabolic Panel: Recent Labs  Lab 09/07/19 1820  NA 137  K 3.5  CL 100  CO2 23  GLUCOSE 99  BUN 18  CREATININE 1.24  CALCIUM 9.9  MG 1.8  PHOS 2.8   Liver Function Tests: No results for input(s): AST, ALT, ALKPHOS, BILITOT, PROT, ALBUMIN in the last 168 hours. No results for input(s): LIPASE, AMYLASE in the last 168 hours. No results for input(s): AMMONIA in the last 168 hours. CBC: Recent Labs  Lab 09/07/19 1820  WBC 9.4  HGB 15.2  HCT 44.6  MCV 90.5  PLT 293   Cardiac Enzymes: No results for input(s): CKTOTAL, CKMB, CKMBINDEX, TROPONINI in the last 168 hours. BNP: Invalid input(s): POCBNP CBG: No results for input(s): GLUCAP in the last 168 hours. D-Dimer Recent Labs    09/07/19 1820  DDIMER 0.30   Hgb A1c No results for input(s): HGBA1C in the last 72 hours. Lipid Profile No results for input(s): CHOL, HDL, LDLCALC, TRIG, CHOLHDL, LDLDIRECT in the last 72 hours. Thyroid function studies No results for input(s): TSH, T4TOTAL, T3FREE, THYROIDAB in the last 72 hours.  Invalid input(s): FREET3 Anemia work up No results for input(s): VITAMINB12, FOLATE, FERRITIN, TIBC, IRON, RETICCTPCT in the last 72 hours. Urinalysis No results found for: COLORURINE, APPEARANCEUR, LABSPEC, PHURINE, GLUCOSEU, HGBUR, BILIRUBINUR, KETONESUR, PROTEINUR, UROBILINOGEN, NITRITE,  LEUKOCYTESUR Sepsis Labs Invalid input(s): PROCALCITONIN,  WBC,  LACTICIDVEN Microbiology Recent Results (from the past 240 hour(s))  SARS Coronavirus 2 by RT PCR (hospital order, performed in [facility] hospital lab) Nasopharyngeal Nasopharyngeal Swab     Status: None   Collection Time: 09/07/19 11:43 PM   Specimen: Nasopharyngeal Swab  Result Value Ref Range Status   SARS Coronavirus 2 NEGATIVE NEGATIVE Final    Comment: (NOTE) SARS-CoV-2 target nucleic acids are NOT DETECTED.  The SARS-CoV-2 RNA is generally detectable in upper and lower respiratory specimens during the acute phase of infection. The lowest concentration of SARS-CoV-2 viral copies this assay can detect is 250 copies / mL. A negative result does not preclude SARS-CoV-2 infection and should not be used as the sole basis for treatment or other patient management decisions.  A negative result may occur with improper  specimen collection / handling, submission of specimen other than nasopharyngeal swab, presence of viral mutation(s) within the areas targeted by this assay, and inadequate number of viral copies (<250 copies / mL). A negative result must be combined with clinical observations, patient history, and epidemiological information.  Fact Sheet for Patients:   BoilerBrush.com.cy  Fact Sheet for Healthcare Providers: https://pope.com/  This test is not yet approved or  cleared by the [place] FDA and has been authorized for detection and/or diagnosis of SARS-CoV-2 by FDA under an Emergency Use Authorization (EUA).  This EUA will remain in effect (meaning this test can be used) for the duration of the COVID-19 declaration under Section 564(b)(1) of the Act, 21 U.S.C. section 360bbb-3(b)(1), unless the authorization is terminated or revoked sooner.  Performed at [facility], [address], [place], [place] [address]   MRSA PCR Screening     Status: None    Collection Time: 09/08/19  2:19 AM   Specimen: Nasopharyngeal  Result Value Ref Range Status   MRSA by PCR NEGATIVE NEGATIVE Final    Comment:        The GeneXpert MRSA Assay (FDA approved for NASAL specimens only), is one component of a comprehensive MRSA colonization surveillance program. It is not intended to diagnose MRSA infection nor to guide or monitor treatment for MRSA infections. Performed at [facility], [address], [place], [place] [address]    Time coordinating discharge:   SIGNED:  Standley Dakins, MD  [facility] 09/08/2019, 5:41 PM How to contact the [facility] Attending or Consulting provider 7A - 7P or covering provider during after hours 7P -7A, for this patient?  1. Check the care team in [facility] and look for a) attending/consulting [facility] provider listed and b) the [facility] team listed 2. Log into [email or web address] and use [place]'s universal password to access. If you do not have the password, please contact the hospital operator. 3. Locate the [facility] provider you are looking for under [facility] and page to a number that you can be directly reached. 4. If you still have difficulty reaching the provider, please page the [facility] (Director on Call) for the Hospitalists listed on amion for assistance.

## 2019-09-08 NOTE — Plan of Care (Signed)
  Problem: Education: Goal: Knowledge of General Education information will improve Description: Including pain rating scale, medication(s)/side effects and non-pharmacologic comfort measures 09/08/2019 1739 by Sheela Stack, RN Outcome: Adequate for Discharge 09/08/2019 0749 by Sheela Stack, RN Outcome: Progressing   Problem: Health Behavior/Discharge Planning: Goal: Ability to manage health-related needs will improve 09/08/2019 1739 by Sheela Stack, RN Outcome: Adequate for Discharge 09/08/2019 0749 by Sheela Stack, RN Outcome: Progressing   Problem: Clinical Measurements: Goal: Ability to maintain clinical measurements within normal limits will improve 09/08/2019 1739 by Sheela Stack, RN Outcome: Adequate for Discharge 09/08/2019 0749 by Sheela Stack, RN Outcome: Progressing Goal: Will remain free from infection 09/08/2019 1739 by Sheela Stack, RN Outcome: Adequate for Discharge 09/08/2019 0749 by Sheela Stack, RN Outcome: Progressing Goal: Diagnostic test results will improve 09/08/2019 1739 by Sheela Stack, RN Outcome: Adequate for Discharge 09/08/2019 0749 by Sheela Stack, RN Outcome: Progressing Goal: Respiratory complications will improve 09/08/2019 1739 by Sheela Stack, RN Outcome: Adequate for Discharge 09/08/2019 0749 by Sheela Stack, RN Outcome: Progressing Goal: Cardiovascular complication will be avoided 09/08/2019 1739 by Sheela Stack, RN Outcome: Adequate for Discharge 09/08/2019 0749 by Sheela Stack, RN Outcome: Progressing   Problem: Activity: Goal: Risk for activity intolerance will decrease 09/08/2019 1739 by Sheela Stack, RN Outcome: Adequate for Discharge 09/08/2019 0749 by Sheela Stack, RN Outcome: Progressing   Problem: Nutrition: Goal: Adequate nutrition will be maintained 09/08/2019 1739 by Sheela Stack, RN Outcome: Adequate for Discharge 09/08/2019 0749 by Sheela Stack, RN Outcome: Progressing    Problem: Coping: Goal: Level of anxiety will decrease 09/08/2019 1739 by Sheela Stack, RN Outcome: Adequate for Discharge 09/08/2019 0749 by Sheela Stack, RN Outcome: Progressing   Problem: Elimination: Goal: Will not experience complications related to bowel motility 09/08/2019 1739 by Sheela Stack, RN Outcome: Adequate for Discharge 09/08/2019 0749 by Sheela Stack, RN Outcome: Progressing Goal: Will not experience complications related to urinary retention 09/08/2019 1739 by Sheela Stack, RN Outcome: Adequate for Discharge 09/08/2019 0749 by Sheela Stack, RN Outcome: Progressing   Problem: Pain Managment: Goal: General experience of comfort will improve 09/08/2019 1739 by Sheela Stack, RN Outcome: Adequate for Discharge 09/08/2019 0749 by Sheela Stack, RN Outcome: Progressing   Problem: Safety: Goal: Ability to remain free from injury will improve 09/08/2019 1739 by Sheela Stack, RN Outcome: Adequate for Discharge 09/08/2019 0749 by Sheela Stack, RN Outcome: Progressing   Problem: Skin Integrity: Goal: Risk for impaired skin integrity will decrease 09/08/2019 1739 by Sheela Stack, RN Outcome: Adequate for Discharge 09/08/2019 0749 by Sheela Stack, RN Outcome: Progressing

## 2019-09-08 NOTE — Discharge Instructions (Signed)
Nonspecific Chest Pain Chest pain can be caused by many different conditions. Some causes of chest pain can be life-threatening. These will require treatment right away. Serious causes of chest pain include:  Heart attack.  A tear in the body's main blood vessel.  Redness and swelling (inflammation) around your heart.  Blood clot in your lungs. Other causes of chest pain may not be so serious. These include:  Heartburn.  Anxiety or stress.  Damage to bones or muscles in your chest.  Lung infections. Chest pain can feel like:  Pain or discomfort in your chest.  Crushing, pressure, aching, or squeezing pain.  Burning or tingling.  Dull or sharp pain that is worse when you move, cough, or take a deep breath.  Pain or discomfort that is also felt in your back, neck, jaw, shoulder, or arm, or pain that spreads to any of these areas. It is hard to know whether your pain is caused by something that is serious or something that is not so serious. So it is important to see your doctor right away if you have chest pain. Follow these instructions at home: Medicines  Take over-the-counter and prescription medicines only as told by your doctor.  If you were prescribed an antibiotic medicine, take it as told by your doctor. Do not stop taking the antibiotic even if you start to feel better. Lifestyle   Rest as told by your doctor.  Do not use any products that contain nicotine or tobacco, such as cigarettes, e-cigarettes, and chewing tobacco. If you need help quitting, ask your doctor.  Do not drink alcohol.  Make lifestyle changes as told by your doctor. These may include: ? Getting regular exercise. Ask your doctor what activities are safe for you. ? Eating a heart-healthy diet. A diet and nutrition specialist (dietitian) can help you to learn healthy eating options. ? Staying at a healthy weight. ? Treating diabetes or high blood pressure, if needed. ? Lowering your stress.  Activities such as yoga and relaxation techniques can help. General instructions  Pay attention to any changes in your symptoms. Tell your doctor about them or any new symptoms.  Avoid any activities that cause chest pain.  Keep all follow-up visits as told by your doctor. This is important. You may need more testing if your chest pain does not go away. Contact a doctor if:  Your chest pain does not go away.  You feel depressed.  You have a fever. Get help right away if:  Your chest pain is worse.  You have a cough that gets worse, or you cough up blood.  You have very bad (severe) pain in your belly (abdomen).  You pass out (faint).  You have either of these for no clear reason: ? Sudden chest discomfort. ? Sudden discomfort in your arms, back, neck, or jaw.  You have shortness of breath at any time.  You suddenly start to sweat, or your skin gets clammy.  You feel sick to your stomach (nauseous).  You throw up (vomit).  You suddenly feel lightheaded or dizzy.  You feel very weak or tired.  Your heart starts to beat fast, or it feels like it is skipping beats. These symptoms may be an emergency. Do not wait to see if the symptoms will go away. Get medical help right away. Call your local emergency services (911 in the [place] Do not drive yourself to the hospital. Summary  Chest pain can be caused by many different conditions. The  cause may be serious and need treatment right away. If you have chest pain, see your doctor right away.  Follow your doctor's instructions for taking medicines and making lifestyle changes.  Keep all follow-up visits as told by your doctor. This includes visits for any further testing if your chest pain does not go away.  Be sure to know the signs that show that your condition has become worse. Get help right away if you have these symptoms. This information is not intended to replace advice given to you by your health care provider. Make  sure you discuss any questions you have with your health care provider. Document Revised: 09/13/2017 Document Reviewed: 09/13/2017 Elsevier Patient Education  2020 [employer]    Acute Bronchitis, Adult  Acute bronchitis is when air tubes in the lungs (bronchi) suddenly get swollen. The condition can make it hard for you to breathe. In adults, acute bronchitis usually goes away within 2 weeks. A cough caused by bronchitis may last up to 3 weeks. Smoking, allergies, and asthma can make the condition worse. What are the causes? This condition is caused by:  Cold and flu viruses. The most common cause of this condition is the virus that causes the common cold.  Bacteria.  Substances that irritate the lungs, including: ? Smoke from cigarettes and other types of tobacco. ? Dust and pollen. ? Fumes from chemicals, gases, or burned fuel. ? Other materials that pollute indoor or outdoor air.  Close contact with someone who has acute bronchitis. What increases the risk? The following factors may make you more likely to develop this condition:  A weak body's defense system. This is also called the immune system.  Any condition that affects your lungs and breathing, such as asthma. What are the signs or symptoms? Symptoms of this condition include:  A cough.  Coughing up clear, yellow, or green mucus.  Wheezing.  Chest congestion.  Shortness of breath.  A fever.  Body aches.  Chills.  A sore throat. How is this treated? Acute bronchitis may go away over time without treatment. Your doctor may recommend:  Drinking more fluids.  Taking a medicine for a fever or cough.  Using a device that gets medicine into your lungs (inhaler).  Using a vaporizer or a humidifier. These are machines that add water or moisture in the air to help with coughing and poor breathing. Follow these instructions at home:  Activity  Get a lot of rest.  Avoid places where there are fumes  from chemicals.  Return to your normal activities as told by your doctor. Ask your doctor what activities are safe for you. Lifestyle  Drink enough fluids to keep your pee (urine) pale yellow.  Do not drink alcohol.  Do not use any products that contain nicotine or tobacco, such as cigarettes, e-cigarettes, and chewing tobacco. If you need help quitting, ask your doctor. Be aware that: ? Your bronchitis will get worse if you smoke or breathe in other people's smoke (secondhand smoke). ? Your lungs will heal faster if you quit smoking. General instructions  Take over-the-counter and prescription medicines only as told by your doctor.  Use an inhaler, cool mist vaporizer, or humidifier as told by your doctor.  Rinse your mouth often with salt water. To make salt water, dissolve -1 tsp (3-6 g) of salt in 1 cup (237 mL) of warm water.  Keep all follow-up visits as told by your doctor. This is important. How is this prevented? To lower  your risk of getting this condition again:  Wash your hands often with soap and water. If soap and water are not available, use hand sanitizer.  Avoid contact with people who have cold symptoms.  Try not to touch your mouth, nose, or eyes with your hands.  Make sure to get the flu shot every year. Contact a doctor if:  Your symptoms do not get better in 2 weeks.  You vomit more than once or twice.  You have symptoms of loss of fluid from your body (dehydration). These include: ? Dark urine. ? Dry skin or eyes. ? Increased thirst. ? Headaches. ? Confusion. ? Muscle cramps. Get help right away if:  You cough up blood.  You have chest pain.  You have very bad shortness of breath.  You become dehydrated.  You faint or keep feeling like you are going to faint.  You keep vomiting.  You have a very bad headache.  Your fever or chills get worse. These symptoms may be an emergency. Do not wait to see if the symptoms will go away. Get  medical help right away. Call your local emergency services (911 in the [place] Do not drive yourself to the hospital. Summary  Acute bronchitis is when air tubes in the lungs (bronchi) suddenly get swollen. In adults, acute bronchitis usually goes away within 2 weeks.  Take over-the-counter and prescription medicines only as told by your doctor.  Drink enough fluid to keep your pee (urine) pale yellow.  Contact a doctor if your symptoms do not improve after 2 weeks of treatment.  Get help right away if you cough up blood, faint, or have chest pain or shortness of breath. This information is not intended to replace advice given to you by your health care provider. Make sure you discuss any questions you have with your health care provider. Document Revised: 10/04/2018 Document Reviewed: 10/04/2018 Elsevier Patient Education  [place].   IMPORTANT INFORMATION: PAY CLOSE ATTENTION   PHYSICIAN DISCHARGE INSTRUCTIONS  Follow with Primary care provider  Patient, No Pcp Per  and other consultants as instructed by your Hospitalist Physician  [place] IF SYMPTOMS COME BACK, WORSEN OR NEW PROBLEM DEVELOPS   Please note: You were cared for by a hospitalist during your hospital stay. Every effort will be made to forward records to your primary care provider.  You can request that your primary care provider send for your hospital records if they have not received them.  Once you are discharged, your primary care physician will handle any further medical issues. Please note that NO REFILLS for any discharge medications will be authorized once you are discharged, as it is imperative that you return to your primary care physician (or establish a relationship with a primary care physician if you do not have one) for your post hospital discharge needs so that they can reassess your need for medications and monitor your lab values.  Please get a complete blood  count and chemistry panel checked by your Primary MD at your next visit, and again as instructed by your Primary MD.  Get Medicines reviewed and adjusted: Please take all your medications with you for your next visit with your Primary MD  Laboratory/radiological data: Please request your Primary MD to go over all hospital tests and procedure/radiological results at the follow up, please ask your primary care provider to get all Hospital records sent to his/her office.  In some cases, they will  be blood work, cultures and biopsy results pending at the time of your discharge. Please request that your primary care provider follow up on these results.  If you are diabetic, please bring your blood sugar readings with you to your follow up appointment with primary care.    Please call and make your follow up appointments as soon as possible.    Also Note the following: If you experience worsening of your admission symptoms, develop shortness of breath, life threatening emergency, suicidal or homicidal thoughts you must seek medical attention immediately by calling 911 or calling your MD immediately  if symptoms less severe.  You must read complete instructions/literature along with all the possible adverse reactions/side effects for all the Medicines you take and that have been prescribed to you. Take any new Medicines after you have completely understood and accpet all the possible adverse reactions/side effects.   Do not drive when taking Pain medications or sleeping medications (Benzodiazepines)  Do not take more than prescribed Pain, Sleep and Anxiety Medications. It is not advisable to combine anxiety,sleep and pain medications without talking with your primary care practitioner  Special Instructions: If you have smoked or chewed Tobacco  in the last 2 yrs please stop smoking, stop any regular Alcohol  and or any Recreational drug use.  Wear Seat belts while driving.  Do not drive if taking  any narcotic, mind altering or controlled substances or recreational drugs or alcohol.

## 2019-09-08 NOTE — Progress Notes (Signed)
*  PRELIMINARY RESULTS* Echocardiogram 2D Echocardiogram has been performed.  Johnny Hebert 09/08/2019, 12:15 PM

## 2019-09-08 NOTE — Plan of Care (Signed)

## 2020-11-28 IMAGING — DX DG CHEST 1V PORT
1 series · 1 of 1 positions shown · non-contrast
Comparison: 09/07/2019

CLINICAL DATA: Shortness of breath

EXAM:
PORTABLE CHEST 1 VIEW

[chest ap grid]
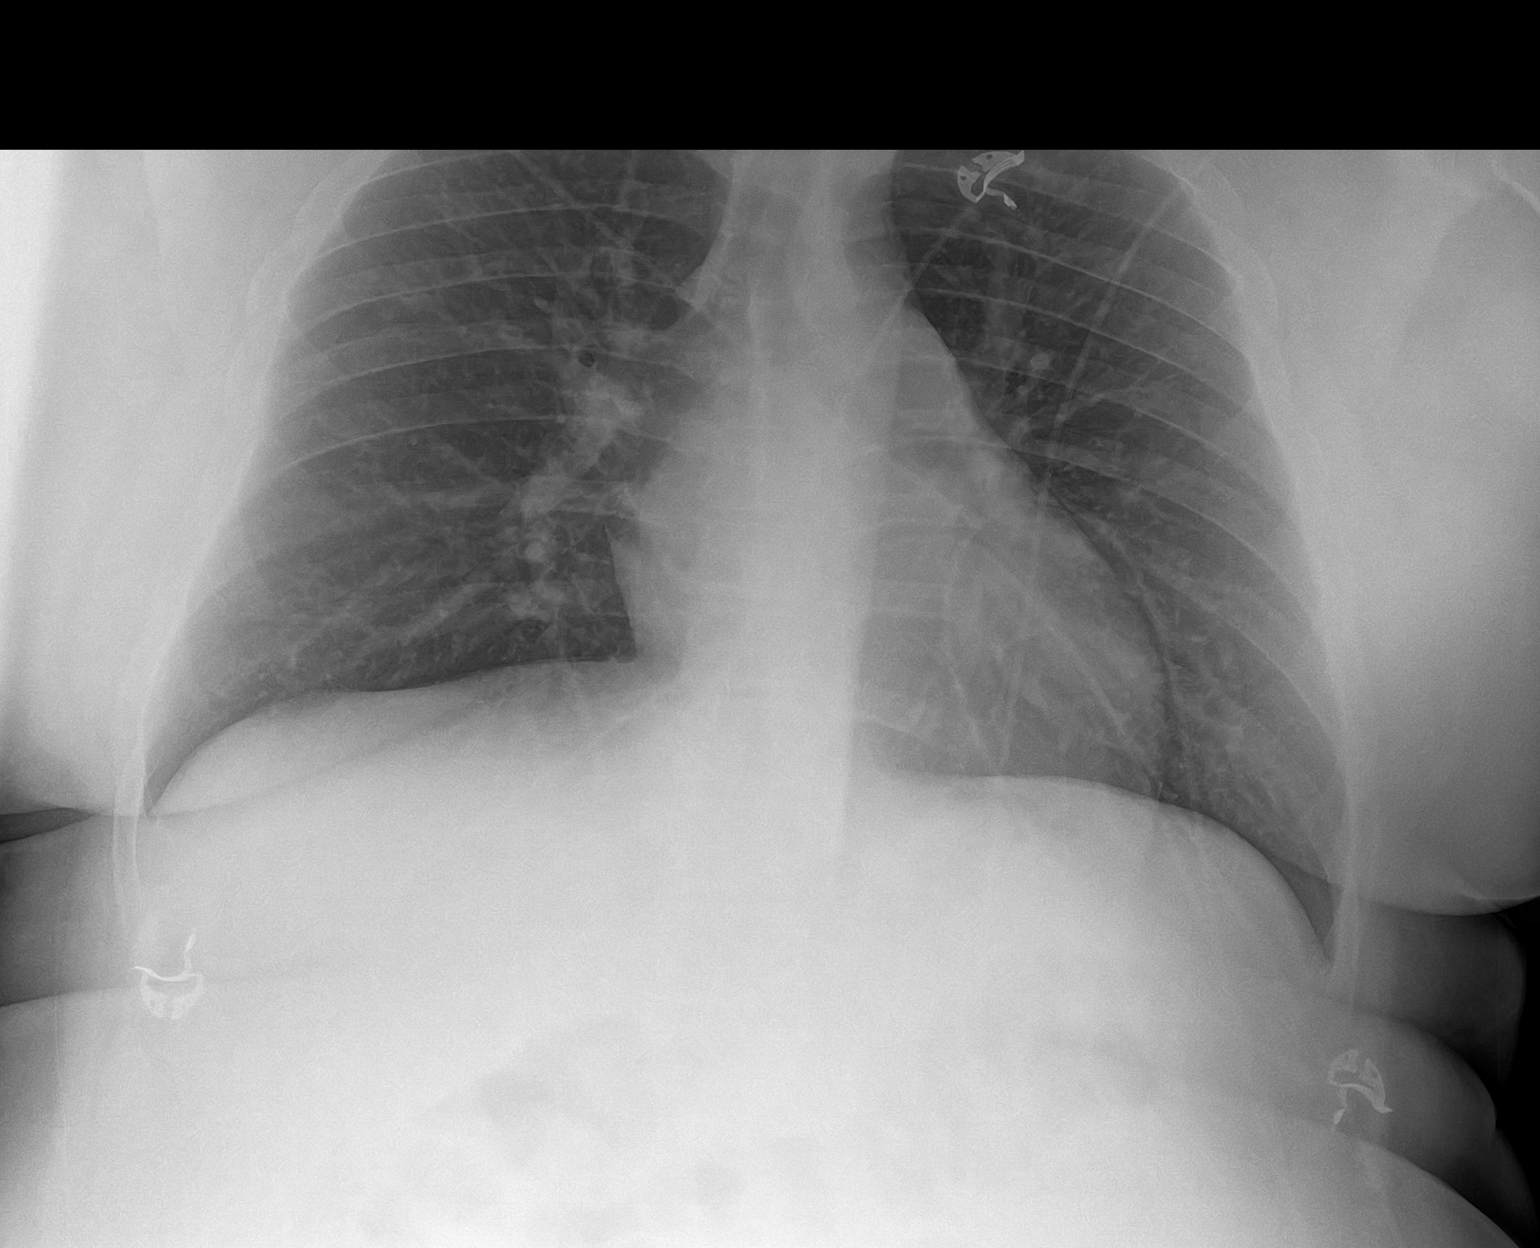

[1 of 1 positions shown; findings below may reference images not displayed]

FINDINGS: The heart size and mediastinal contours are within normal limits.
Mildly prominent peribronchial markings bilaterally, similar to
prior. No new focal airspace consolidation, pleural effusion, or
pneumothorax. The visualized skeletal structures are unremarkable.
IMPRESSION: Mildly prominent peribronchial markings bilaterally, similar to
prior study, may reflect bronchitis. No new focal airspace
consolidation.

## 2023-09-25 ENCOUNTER — Encounter (HOSPITAL_COMMUNITY): Payer: Self-pay

## 2023-09-25 ENCOUNTER — Other Ambulatory Visit: Payer: Self-pay

## 2023-09-25 ENCOUNTER — Emergency Department (HOSPITAL_COMMUNITY)
Admission: EM | Admit: 2023-09-25 | Discharge: 2023-09-25 | Disposition: A | Discharge status: Home / Self Care | Attending: Emergency Medicine | Admitting: Emergency Medicine

## 2023-09-25 ENCOUNTER — Emergency Department (HOSPITAL_COMMUNITY)

## 2023-09-25 DIAGNOSIS — Z7982 Long term (current) use of aspirin: Secondary | ICD-10-CM | POA: Insufficient documentation

## 2023-09-25 DIAGNOSIS — R1030 Lower abdominal pain, unspecified: Secondary | ICD-10-CM | POA: Insufficient documentation

## 2023-09-25 HISTORY — DX: Gastro-esophageal reflux disease without esophagitis: K21.9

## 2023-09-25 HISTORY — DX: Acute myocardial infarction, unspecified: I21.9

## 2023-09-25 HISTORY — DX: Disorder of kidney and ureter, unspecified: N28.9

## 2023-09-25 HISTORY — DX: Hyperlipidemia, unspecified: E78.5

## 2023-09-25 HISTORY — DX: Obstructive sleep apnea (adult) (pediatric): G47.33

## 2023-09-25 HISTORY — DX: Unspecified asthma, uncomplicated: J45.909

## 2023-09-25 HISTORY — DX: Essential (primary) hypertension: I10

## 2023-09-25 LAB — COMPREHENSIVE METABOLIC PANEL WITH GFR
ALT: 29 U/L (ref 0–44)
AST: 19 U/L (ref 15–41)
Albumin: 3.9 g/dL (ref 3.5–5.0)
Alkaline Phosphatase: 70 U/L (ref 38–126)
Anion gap: 9 (ref 5–15)
BUN: 19 mg/dL (ref 6–20)
CO2: 24 mmol/L (ref 22–32)
Calcium: 9.3 mg/dL (ref 8.9–10.3)
Chloride: 105 mmol/L (ref 98–111)
Creatinine, Ser: 1.22 mg/dL (ref 0.61–1.24)
GFR, Estimated: >60 mL/min (ref >60)
Glucose, Bld: 96 mg/dL (ref 70–99)
Potassium: 4.2 mmol/L (ref 3.5–5.1)
Sodium: 138 mmol/L (ref 135–145)
Total Bilirubin: 0.5 mg/dL (ref 0.0–1.2)
Total Protein: 7.4 g/dL (ref 6.5–8.1)

## 2023-09-25 LAB — CBC WITH DIFFERENTIAL/PLATELET
Abs Immature Granulocytes: 0.02 10*3/uL (ref 0.00–0.07)
Basophils Absolute: 0 10*3/uL (ref 0.0–0.1)
Basophils Relative: 1 %
Eosinophils Absolute: 0.1 10*3/uL (ref 0.0–0.5)
Eosinophils Relative: 1 %
HCT: 44 % (ref 39.0–52.0)
Hemoglobin: 14.4 g/dL (ref 13.0–17.0)
Immature Granulocytes: 0 %
Lymphocytes Relative: 33 %
Lymphs Abs: 2.3 10*3/uL (ref 0.7–4.0)
MCH: 29.4 pg (ref 26.0–34.0)
MCHC: 32.7 g/dL (ref 30.0–36.0)
MCV: 89.8 fL (ref 80.0–100.0)
Monocytes Absolute: 0.7 10*3/uL (ref 0.1–1.0)
Monocytes Relative: 10 %
Neutro Abs: 3.8 10*3/uL (ref 1.7–7.7)
Neutrophils Relative %: 55 %
Platelets: 278 10*3/uL (ref 150–400)
RBC: 4.9 MIL/uL (ref 4.22–5.81)
RDW: 12.8 % (ref 11.5–15.5)
WBC: 7 10*3/uL (ref 4.0–10.5)
nRBC: 0 % (ref 0.0–0.2)

## 2023-09-25 LAB — URINALYSIS, ROUTINE W REFLEX MICROSCOPIC
Bacteria, UA: NONE SEEN
Bilirubin Urine: NEGATIVE
Glucose, UA: NEGATIVE mg/dL
Hgb urine dipstick: NEGATIVE
Ketones, ur: NEGATIVE mg/dL
Leukocytes,Ua: NEGATIVE
Nitrite: NEGATIVE
Protein, ur: 30 mg/dL — AB
Specific Gravity, Urine: 1.029 (ref 1.005–1.030)
pH: 7 (ref 5.0–8.0)

## 2023-09-25 LAB — LIPASE, BLOOD: Lipase: 27 U/L (ref 11–51)

## 2023-09-25 MED ORDER — ONDANSETRON HCL 4 MG/2ML IJ SOLN
4.0000 mg | Freq: Once | INTRAMUSCULAR | Status: AC
  Administered 2023-09-25: 4 mg via INTRAVENOUS
  Filled 2023-09-25: qty 2

## 2023-09-25 MED ORDER — MORPHINE SULFATE (PF) 4 MG/ML IV SOLN
4.0000 mg | Freq: Once | INTRAVENOUS | Status: AC
  Administered 2023-09-25: 4 mg via INTRAVENOUS
  Filled 2023-09-25: qty 1

## 2023-09-25 MED ORDER — DICYCLOMINE HCL 10 MG PO CAPS
10.0000 mg | ORAL_CAPSULE | Freq: Once | ORAL | Status: AC
  Administered 2023-09-25: 10 mg via ORAL
  Filled 2023-09-25: qty 1

## 2023-09-25 MED ORDER — IOHEXOL 300 MG/ML  SOLN
100.0000 mL | Freq: Once | INTRAMUSCULAR | Status: AC | PRN
  Administered 2023-09-25: 100 mL via INTRAVENOUS

## 2023-09-25 MED ORDER — SODIUM CHLORIDE 0.9 % IV BOLUS
500.0000 mL | Freq: Once | INTRAVENOUS | Status: AC
  Administered 2023-09-25: 500 mL via INTRAVENOUS

## 2023-09-25 MED ORDER — SODIUM CHLORIDE 0.9 % IV BOLUS
1000.0000 mL | Freq: Once | INTRAVENOUS | Status: AC
  Administered 2023-09-25: 1000 mL via INTRAVENOUS

## 2023-09-25 MED ORDER — OXYCODONE-ACETAMINOPHEN 5-325 MG PO TABS
1.0000 | ORAL_TABLET | Freq: Once | ORAL | Status: AC
  Administered 2023-09-25: 1 via ORAL
  Filled 2023-09-25: qty 1

## 2023-09-25 NOTE — Discharge Instructions (Signed)
 [place] diet as tolerated.  Follow-up with your primary care provider for recheck return to the ER for any new or worsening symptoms

## 2023-09-25 NOTE — ED Triage Notes (Signed)
 Pt arrived via private custody from [facility] [employer] c/o lower abdominal pain, and pressure on the right side. Pt also reports recent periods of constipation.

## 2023-09-28 NOTE — ED Provider Notes (Signed)
 [place] EMERGENCY DEPARTMENT AT [facility] Provider Note   CSN: [id number] Arrival date & time: 09/25/23  1622     Patient presents with: Abdominal Pain   Johnny Hebert is a 28 y.o. male.    Abdominal Pain Associated symptoms: constipation   Associated symptoms: no chest pain, no chills, no dysuria, no fever and no shortness of breath        Johnny Hebert is a 28 y.o. 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  States his pain began 1 day prior.  He was having issue with constipation states he took a laxative had relief of his constipation, but began having crampy lower abdominal pain afterwards.  Denies any fever, chills, vomiting, flank pain, black or bloody stools.   Prior to Admission medications   Medication Sig Start Date End Date Taking? Authorizing Provider  amLODipine (NORVASC) 5 MG tablet Take 5 mg by mouth. 03/15/20  Yes [provider]  pioglitazone (ACTOS) 15 MG tablet Take 15 mg by mouth daily.   Yes [provider]  acetaminophen  (TYLENOL ) 325 MG tablet Take 2 tablets (650 mg total) by mouth every 6 (six) hours as needed for mild pain, moderate pain or headache (or Fever >/= 101). 09/08/19   Johnson, Clanford L, MD  albuterol  (VENTOLIN  HFA) 108 (90 Base) MCG/ACT inhaler Inhale 2 puffs into the lungs every 4 (four) hours as needed for wheezing or shortness of breath (cough, shortness of breath or wheezing.). 09/08/19   Johnson, Clanford L, MD  aspirin  EC 81 MG tablet Take 81 mg by mouth daily.    [provider]  omeprazole  (PRILOSEC) 20 MG capsule Take 1 capsule (20 mg total) by mouth daily. 09/08/19 10/08/19  Johnson, Clanford L, MD  rosuvastatin (CRESTOR) 5 MG tablet Take 5 mg by mouth daily.    [provider]    Allergies: Patient has no known allergies.    Review of Systems  Constitutional:  Negative for chills  and fever.  Respiratory:  Negative for shortness of breath.   Cardiovascular:  Negative for chest pain.  Gastrointestinal:  Positive for abdominal pain and constipation. Negative for blood in stool.  Genitourinary:  Negative for dysuria and flank pain.  Neurological:  Negative for weakness.    Updated Vital Signs BP (!) 102/58   Pulse 63   Temp 98.6 F (37 C) (Oral)   Resp 18   Ht 5' 5 (1.651 m)   Wt 130 kg   SpO2 98%   BMI 47.69 kg/m   Physical Exam Vitals and nursing note reviewed.  Constitutional:      General: He is not in acute distress.    Appearance: He is well-developed. He is not toxic-appearing.  HENT:     Mouth/Throat:     Mouth: Mucous membranes are moist.  Cardiovascular:     Rate and Rhythm: Normal rate and regular rhythm.     Pulses: Normal pulses.  Pulmonary:     Effort: Pulmonary effort is normal.     Breath sounds: Normal breath sounds.  Abdominal:     General: There is no distension.     Palpations: Abdomen is soft.     Tenderness: There is abdominal tenderness. There is no right CVA tenderness, left CVA tenderness or guarding.     Comments: Diffuse tenderness lower abdomen.  No guarding or rebound.  Abdomen is soft  Musculoskeletal:  General: Normal range of motion.  Neurological:     General: No focal deficit present.     Mental Status: He is alert.     Sensory: No sensory deficit.     Motor: No weakness.     (all labs ordered are listed, but only abnormal results are displayed) Labs Reviewed  URINALYSIS, ROUTINE W REFLEX MICROSCOPIC - Abnormal; Notable for the following components:      Result Value   Protein, ur 30 (*)    All other components within normal limits  LIPASE, BLOOD  COMPREHENSIVE METABOLIC PANEL WITH GFR  CBC WITH DIFFERENTIAL/PLATELET    EKG: None  Radiology: No results found.   Procedures   Medications Ordered in the ED  sodium chloride  0.9 % bolus 500 mL (0 mLs Intravenous Stopped 09/25/23 1838)   ondansetron  (ZOFRAN ) injection 4 mg (4 mg Intravenous Given 09/25/23 1712)  morphine  (PF) 4 MG/ML injection 4 mg (4 mg Intravenous Given 09/25/23 1713)  iohexol  (OMNIPAQUE ) 300 MG/ML solution 100 mL (100 mLs Intravenous Contrast Given 09/25/23 1827)  sodium chloride  0.9 % bolus 1,000 mL (0 mLs Intravenous Stopped 09/25/23 2106)  oxyCODONE -acetaminophen  (PERCOCET/ROXICET) 5-325 MG per tablet 1 tablet (1 tablet Oral Given 09/25/23 1958)  dicyclomine  (BENTYL ) capsule 10 mg (10 mg Oral Given 09/25/23 2021)                                    Medical Decision Making 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  No fever chills nausea or vomiting.   I suspect pain secondary to intestinal spasms, acute surgical abdomen also considered but felt less likely  Amount and/or Complexity of Data Reviewed Labs: ordered.    Details: Labs unremarkable Radiology: ordered.    Details: CT abdomen and pelvis was ordered for further evaluation.  No acute finding Discussion of management or test interpretation with external provider(s): Possible intestinal spasms.  Patient got relief after receiving Bentyl .  I feel that he is appropriate for discharge back to facility at this time.  Return precautions given.  Counseled on avoidance of excessive laxatives  Risk Prescription drug management.        Final diagnoses:  Lower abdominal pain    ED Discharge Orders     None          Herlinda Milling, PA-C 09/28/23 1409    Suzette Pac, MD 09/29/23 [phone]
